# Patient Record
Sex: Male | Born: 1980 | Race: Black or African American | Smoking: Never smoker
Health system: Southern US, Community
[De-identification: ages and names within clinical notes are randomized; demographics above are authoritative.]

## PROBLEM LIST (undated history)

## (undated) DIAGNOSIS — R200 Anesthesia of skin: Secondary | ICD-10-CM

## (undated) HISTORY — DX: Anesthesia of skin: R20.0

---

## 2015-03-16 ENCOUNTER — Other Ambulatory Visit: Payer: Self-pay | Admitting: Physician Assistant

## 2015-03-16 DIAGNOSIS — R51 Headache: Principal | ICD-10-CM

## 2015-03-16 DIAGNOSIS — R519 Headache, unspecified: Secondary | ICD-10-CM

## 2015-03-21 ENCOUNTER — Other Ambulatory Visit: Payer: Self-pay | Admitting: Physician Assistant

## 2015-03-21 DIAGNOSIS — R519 Headache, unspecified: Secondary | ICD-10-CM

## 2015-03-21 DIAGNOSIS — R51 Headache: Principal | ICD-10-CM

## 2015-03-26 ENCOUNTER — Ambulatory Visit
Admission: RE | Admit: 2015-03-26 | Discharge: 2015-03-26 | Disposition: A | Discharge status: Home / Self Care | Payer: 59 | Source: Ambulatory Visit | Attending: Physician Assistant | Admitting: Physician Assistant

## 2015-03-26 DIAGNOSIS — R51 Headache: Principal | ICD-10-CM

## 2015-03-26 DIAGNOSIS — R519 Headache, unspecified: Secondary | ICD-10-CM

## 2015-03-29 ENCOUNTER — Ambulatory Visit (INDEPENDENT_AMBULATORY_CARE_PROVIDER_SITE_OTHER): Payer: 59 | Admitting: Neurology

## 2015-03-29 ENCOUNTER — Encounter: Payer: Self-pay | Admitting: Neurology

## 2015-03-29 VITALS — BP 105/67 | HR 85 | Ht 73.0 in | Wt 227.2 lb

## 2015-03-29 DIAGNOSIS — G43711 Chronic migraine without aura, intractable, with status migrainosus: Secondary | ICD-10-CM

## 2015-03-29 DIAGNOSIS — G43901 Migraine, unspecified, not intractable, with status migrainosus: Secondary | ICD-10-CM | POA: Insufficient documentation

## 2015-03-29 MED ORDER — AMITRIPTYLINE HCL 25 MG PO TABS: 25.0000 mg | ORAL_TABLET | Freq: Every day | ORAL | Status: AC

## 2015-03-29 MED ORDER — AMITRIPTYLINE HCL 10 MG PO TABS: 10.0000 mg | ORAL_TABLET | Freq: Every day | ORAL | Status: AC

## 2015-03-29 NOTE — Patient Instructions (Addendum)
-   will start amitriptyline at night for headache prevention.  every night for 7 days and then  at night after - continue Excedrin or Aleve for headache abortion if needed. Not more than 2 a day or 5 a week. - if headache getting worse and out of normal, may consider go to ER for evaluation - keep hydrated and avoid missing meals. - follow up in one month.

## 2015-03-31 ENCOUNTER — Encounter: Payer: Self-pay | Admitting: Neurology

## 2015-03-31 NOTE — Progress Notes (Signed)
NEUROLOGY CLINIC NEW PATIENT NOTE  NAME: Carl Drake DOB: [birth year] REFERRING PHYSICIAN: Milus Height, PA-C  I saw Carl Drake as a new consult in the neurovascular clinic today regarding  Chief Complaint  Patient presents with  . New Evaluation    Yearly check in with Neurologist, History of Abnormal MRI  .  HPI: Carl Drake is a 34 y.o. male with no SIGNIFICANT PMH who presents as a new patient for Headache.   Patient stated that in 2010, he had sudden onset left leg weakness in the afternoon, then started to have left face feeling funny. Second day in the morning, he had left sided weakness, left facial droop with slurry speech. The day before all this happened, he had severe headache at top of head, gradually went away after the symptoms started. He was at [facility] at the time and was admitted to hospital for 3 days, had MRI, LP and underwent rehabilitation. The weakness finally resolved in about 4 months.  In 2013, he also had a one episode of severe headache lasting a few hours with whole-body feeding bed. However no weakness, numbness, LOC. He did not seek medical attention.  Since last months, he started having headaches again, on the top of the head, 3-4/10, no phonophobia, photophobia, or nausea vomiting. However if headaches severe enough, he will have photophobia and phonophobia. Headache can start in the morning or at work, never went away. He takes Exedrin or Aleve, which can give temporary relief. He is not taking any medication for headache prophylaxis. This morning he had right eye conjunctiva projections. Had MRI brain several days ago, which was normal.  He noticed if he did not eat or not eating right he may have mild neck and headache, aspirin will help. He denies any other significant past medical history. He is generally healthy. Denies any significant family history is. Denies any stress from home or work. Denies smoking, or illicit drugs. Admitted to  social drinking less than 3 drinks per week.  Past Medical History  Diagnosis Date  . Left sided numbness    No past surgical history on file. No family history on file. Current Outpatient Prescriptions  Medication Sig Dispense Refill  . amitriptyline (ELAVIL) 10 MG tablet Take 1 tablet (10 mg total) by mouth at bedtime. 7 tablet 0  . [START ON 04/05/2015] amitriptyline (ELAVIL) 25 MG tablet Take 1 tablet (25 mg total) by mouth at bedtime. 30 tablet 3   No current facility-administered medications for this visit.   No Known Allergies Social History   Social History  . Marital Status: Unknown    Spouse Name: N/A  . Number of Children: 2  . Years of Education: 16   Occupational History  . [employer]    Social History Main Topics  . Smoking status: Never Smoker   . Smokeless tobacco: Never Used  . Alcohol Use: Yes     Comment: 3x a week   . Drug Use: No  . Sexual Activity: Not on file   Other Topics Concern  . Not on file   Social History Narrative   Lives with at home with children   48 ounces of caffeine a week     Review of Systems Full 14 system review of systems performed and notable only for those listed, all others are neg:  Constitutional:  Fatigue Cardiovascular:  Ear/Nose/Throat:   Skin:  Eyes:   Respiratory:   Gastroitestinal:   Genitourinary:  Hematology/Lymphatic:  Endocrine:  Musculoskeletal:   Allergy/Immunology:   Neurological:  Headache Psychiatric:  Sleep: Shift work   Physical Exam  Filed Vitals:   03/29/15 1405  BP: 105/67  Pulse: 85    General - Well nourished, well developed, in no apparent distress.  Ophthalmologic - Sharp disc margins OU.  Cardiovascular - Regular rate and rhythm with no murmur. Carotid pulses were 2+ without bruits .   Neck - supple, no nuchal rigidity .  Mental Status -  Level of arousal and orientation to time, place, and person were intact. Language including expression, naming,  repetition, comprehension, reading, and writing was assessed and found intact. Attention span and concentration were normal. Recent and remote memory were intact. Fund of Knowledge was assessed and was intact.  Cranial Nerves II - XII - II - Visual field intact OU. III, IV, VI - Extraocular movements intact. V - Facial sensation intact bilaterally. VII - Facial movement intact bilaterally. VIII - Hearing & vestibular intact bilaterally. X - Palate elevates symmetrically. XI - Chin turning & shoulder shrug intact bilaterally. XII - Tongue protrusion intact.  Motor Strength - The patient's strength was normal in all extremities and pronator drift was absent.  Bulk was normal and fasciculations were absent.   Motor Tone - Muscle tone was assessed at the neck and appendages and was normal.  Reflexes - The patient's reflexes were normal in all extremities and he had no pathological reflexes.  Sensory - Light touch, temperature/pinprick, vibration and proprioception, and Romberg testing were assessed and were normal.    Coordination - The patient had normal movements in the hands and feet with no ataxia or dysmetria.  Tremor was absent.  Gait and Station - The patient's transfers, posture, gait, station, and turns were observed as normal.   Imaging  I have personally reviewed the radiological images below and agree with the radiology interpretations.  MRI 03/26/15 Normal examination. No cause of headache identified. No evidence of demyelinating disease.  Lab Review none   Assessment and Plan:   In summary, Carl Drake is a 34 y.o. male with no significant PMH presents  as new patient for headache. He had headache with right-sided hemiplegia in 2010, recovered in 4 months. Had one episode of severe headache into 2013 and then daily headache since 1 month ago without associated weakness. Neuro exam normal, MR brain negative. His first episode in 2010 concerning for demyelinating  disease first episode however, MRI brain had no evidence of MS. It could also be hemiplegic migraine, however patient has no family history. With episode in 2013 and the recently HA, it could be just regular migraine. Will start headache prophylaxis.   - will start amitriptyline at night for headache prevention. 10mg  Qhs for 7 days and then 25mg  Qhs after - continue Excedrin or Aleve for headache abortion if needed.  - if headache getting worse and out of normal such as weakness, may need ER evaluation - keep hydrated and avoid missing meals. - RTC in one month.  Thank you very much for the opportunity to participate in the care of this patient.  Please do not hesitate to call if any questions or concerns arise.  No orders of the defined types were placed in this encounter.    Meds ordered this encounter  Medications  . amitriptyline (ELAVIL) 10 MG tablet    Sig: Take 1 tablet (10 mg total) by mouth at bedtime.    Dispense:  7 tablet    Refill:  0  . amitriptyline (ELAVIL) 25 MG tablet    Sig: Take 1 tablet (25 mg total) by mouth at bedtime.    Dispense:  30 tablet    Refill:  3    Patient Instructions  - will start amitriptyline at night for headache prevention.  every night for 7 days and then  at night after - continue Excedrin or Aleve for headache abortion if needed. Not more than 2 a day or 5 a week. - if headache getting worse and out of normal, may consider go to ER for evaluation - keep hydrated and avoid missing meals. - follow up in one month.   Marvel Plan, MD PhD [facility] [address], [address] [place], [place] [address] [phone]

## 2015-05-09 ENCOUNTER — Ambulatory Visit: Payer: 59 | Admitting: Neurology

## 2016-11-12 ENCOUNTER — Other Ambulatory Visit: Payer: Self-pay | Admitting: Physician Assistant

## 2016-11-12 DIAGNOSIS — R319 Hematuria, unspecified: Secondary | ICD-10-CM

## 2016-11-19 ENCOUNTER — Ambulatory Visit
Admission: RE | Admit: 2016-11-19 | Discharge: 2016-11-19 | Disposition: A | Discharge status: Home / Self Care | Payer: 59 | Source: Ambulatory Visit | Attending: Physician Assistant | Admitting: Physician Assistant

## 2016-11-19 DIAGNOSIS — R319 Hematuria, unspecified: Secondary | ICD-10-CM

## 2018-09-09 IMAGING — US US RENAL
1 series · 14 of 25 positions shown · non-contrast
Comparison: No prior.

CLINICAL DATA: Chronic hematuria.

EXAM:
RENAL / URINARY TRACT ULTRASOUND COMPLETE

[Series 1: us renal · 0.25mm/px · 14 of 46 slices shown]
[im 1/46]
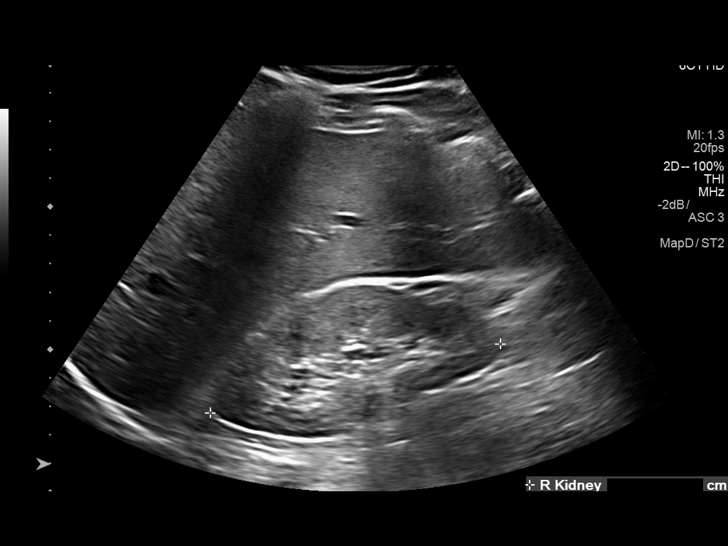
[im 4/46]
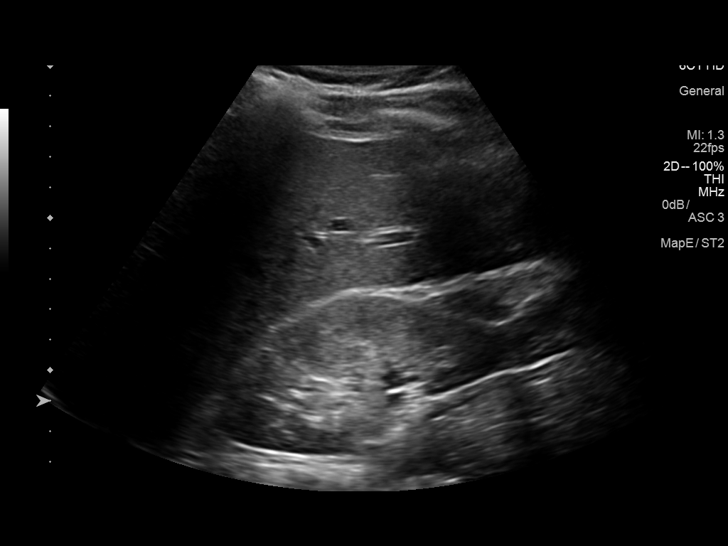
[im 8/46]
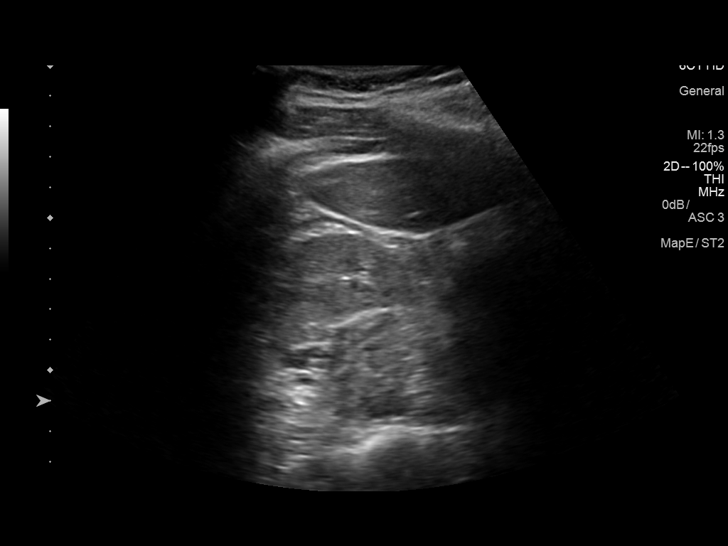
[im 12/46]
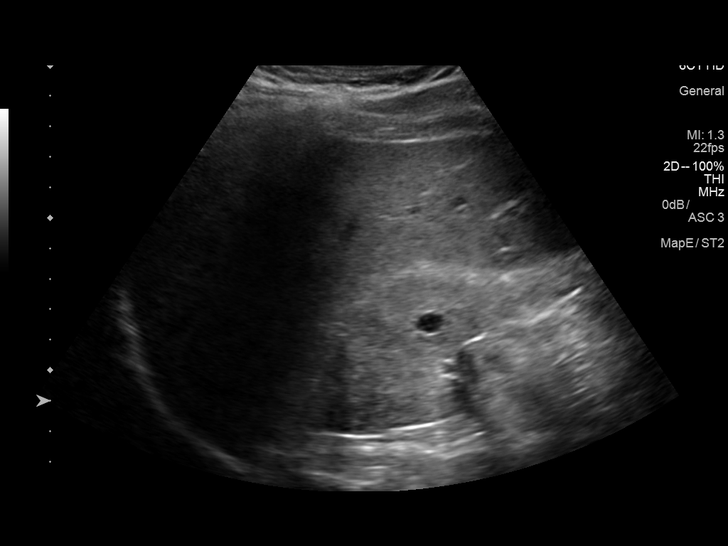
[im 16/46]
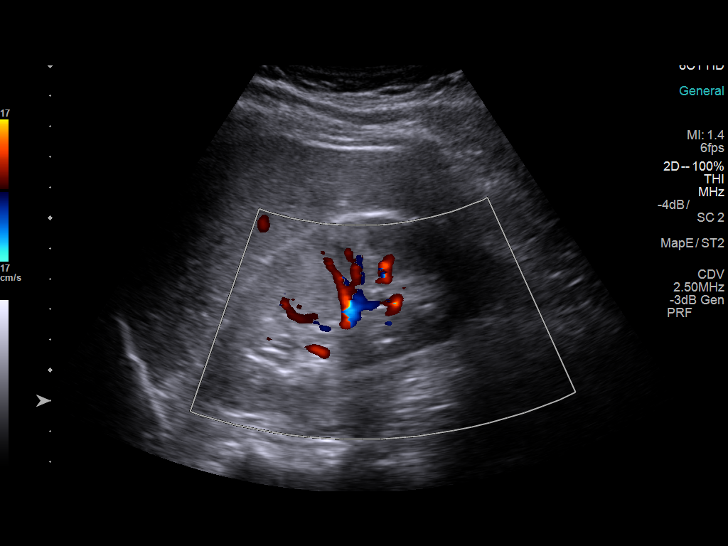
[im 17/46]
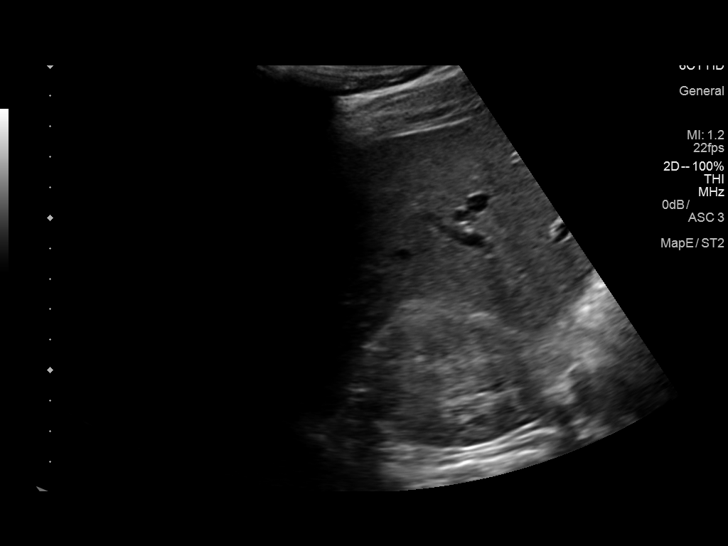
[im 21/46]
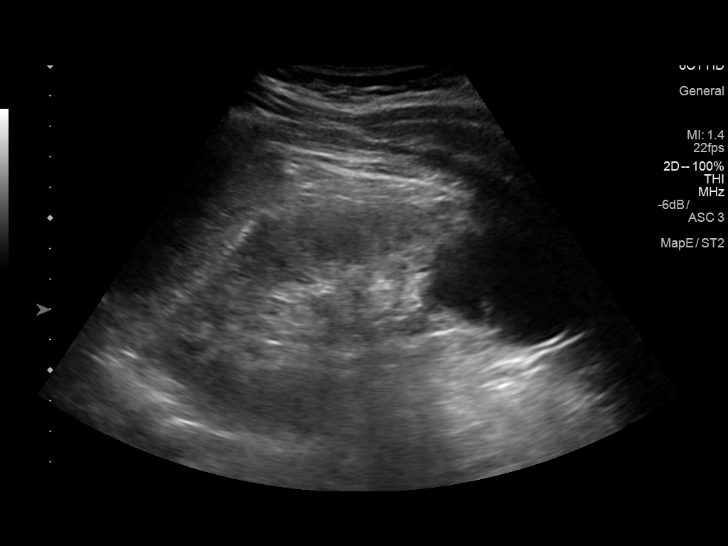
[im 25/46]
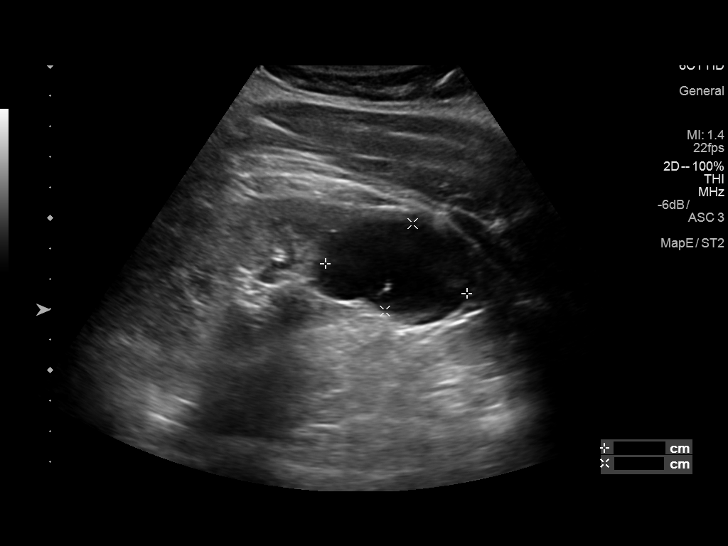
[im 29/46]
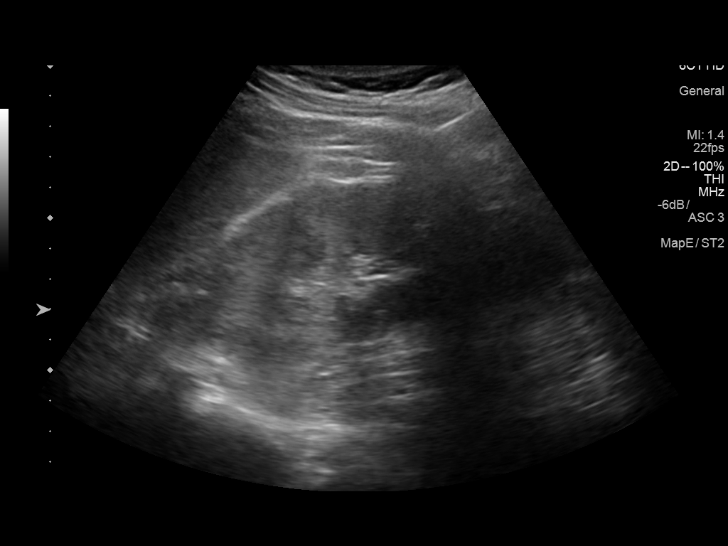
[im 31/46]
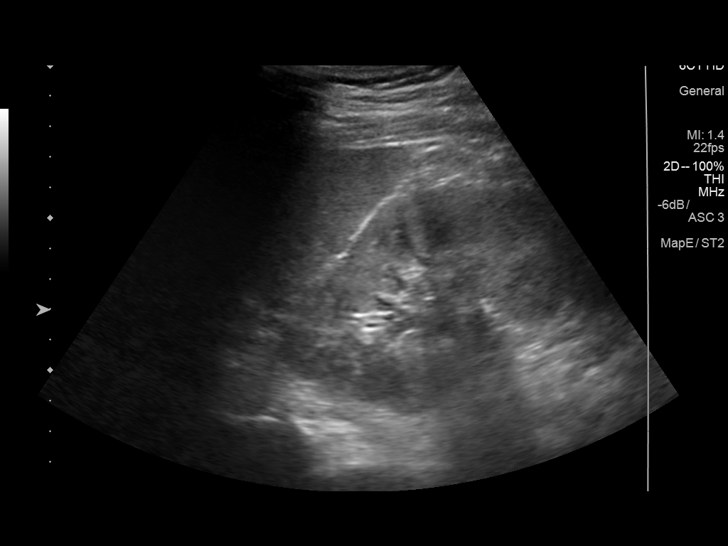
[im 34/46]
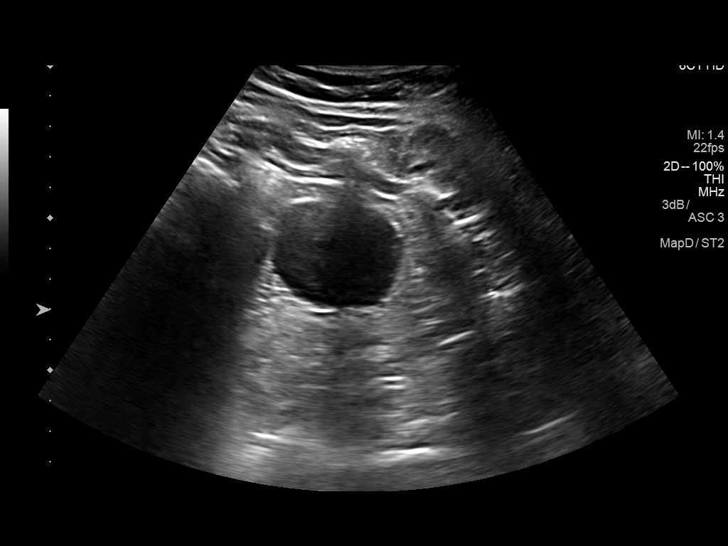
[im 38/46]
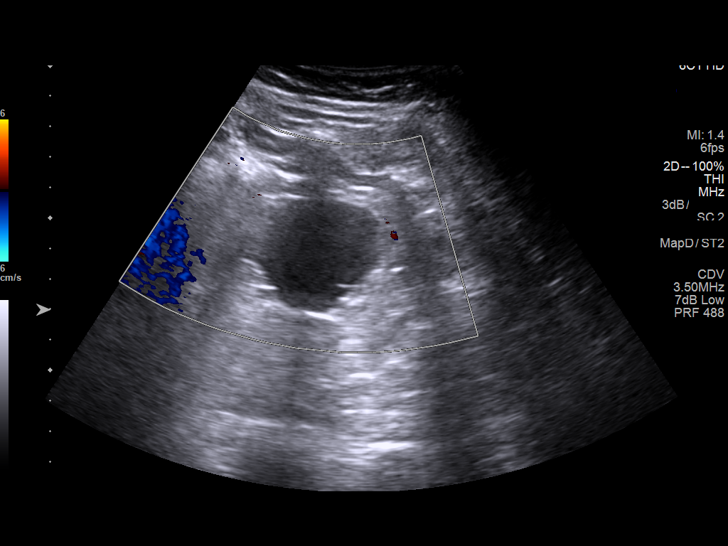
[im 42/46]
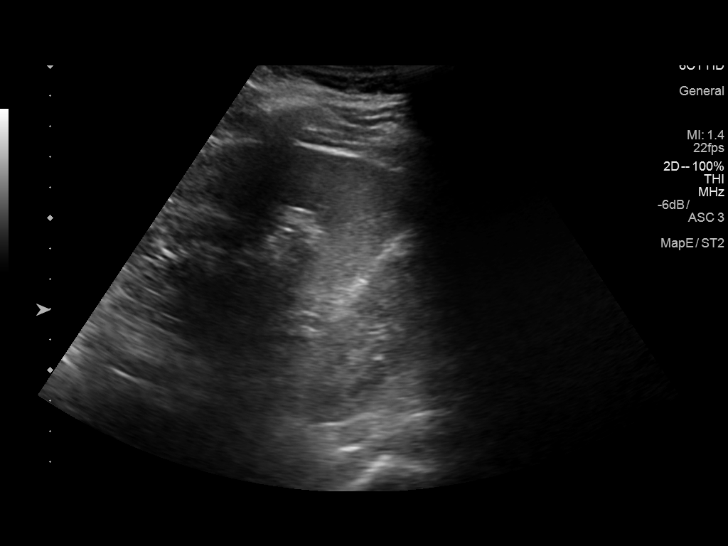
[im 46/46]
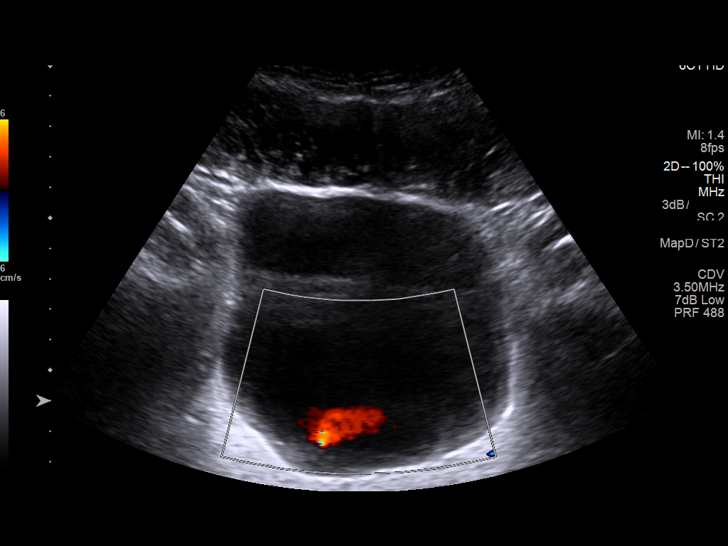

[14 of 25 positions shown; findings below may reference images not displayed]

FINDINGS: Right Kidney:

Length: 10.5 cm. Echogenicity within normal limits. No
hydronephrosis visualized. 0.9 x 0.6 x 0.7 cm simple cyst.

Left Kidney:

Length: 12.0 cm. Echogenicity within normal limits. No or
hydronephrosis visualized. 4.8 x 3.0 x 4.8 cm complex cyst lower
pole left kidney. Renal protocol MRI of the kidneys suggested for
further evaluation.

Bladder:

Appears normal for degree of bladder distention.
IMPRESSION: 1. 4.8 x 3.0 x 4.8 cm complex cystic mass lower pole left kidney.
Renal protocol MRI suggested for further evaluation.

2.  9 mm simple cyst right kidney.
